# Patient Record
Sex: Male | Born: 1935
Health system: Southern US, Community
[De-identification: ages and names within clinical notes are randomized; demographics above are authoritative.]

## PROBLEM LIST (undated history)

## (undated) DIAGNOSIS — R569 Unspecified convulsions: Secondary | ICD-10-CM

## (undated) DIAGNOSIS — E079 Disorder of thyroid, unspecified: Secondary | ICD-10-CM

---

## 2014-05-07 ENCOUNTER — Other Ambulatory Visit: Payer: Self-pay | Admitting: Internal Medicine

## 2014-05-07 DIAGNOSIS — R569 Unspecified convulsions: Secondary | ICD-10-CM

## 2014-05-15 ENCOUNTER — Other Ambulatory Visit: Payer: Self-pay

## 2014-05-20 ENCOUNTER — Ambulatory Visit (INDEPENDENT_AMBULATORY_CARE_PROVIDER_SITE_OTHER): Payer: Medicare Other | Admitting: Radiology

## 2014-05-20 ENCOUNTER — Encounter (INDEPENDENT_AMBULATORY_CARE_PROVIDER_SITE_OTHER): Payer: Self-pay

## 2014-05-20 DIAGNOSIS — R404 Transient alteration of awareness: Secondary | ICD-10-CM

## 2014-05-20 NOTE — Procedures (Signed)
      Benjamin Burton is a 80 year old gentleman with a recent episode of loss of consciousness associated with generalized tonic-clonic movements, with confusion lasting about 30 minutes following this event. The patient has had 2 other episodes of syncope over the past several months. The patient is being evaluated for the seizures. He does have a history of Alzheimer's disease.  This is a routine EEG. No skull defects are noted. The patient currently is on Keppra.  EEG classification: Dysrhythmia grade 1 generalized  Description of the recording: The background rhythms of this recording consists of a moderately well modulated medium amplitude rhythm of 7-8 Hz. As the record progresses, the patient appears to remain in the waking state throughout the recording. The mild background slowing seen throughout the recording and is symmetric from one side to the next. Photic stimulation is performed, and this results in a bilateral photic driving response. Hyperventilation was not performed. At no time during the recording does there appear to be evidence of spike or spike wave discharges or evidence of focal slowing. EKG monitor shows no evidence of cardiac rhythm abnormalities with a heart rate of 60.  Impression: This is a minimally abnormal EEG recording secondary to mild background slowing in the theta frequency range. This is a nonspecific recording, and can be seen in any process that results in a mild toxic or metabolic encephalopathy or any dementing illness. No epileptiform discharges are seen during the study.

## 2014-05-21 ENCOUNTER — Other Ambulatory Visit: Payer: Self-pay | Admitting: Internal Medicine

## 2014-05-21 ENCOUNTER — Ambulatory Visit
Admission: RE | Admit: 2014-05-21 | Discharge: 2014-05-21 | Disposition: A | Discharge status: Home / Self Care | Payer: Medicare Other | Source: Ambulatory Visit | Attending: Internal Medicine | Admitting: Internal Medicine

## 2014-05-21 DIAGNOSIS — R569 Unspecified convulsions: Secondary | ICD-10-CM

## 2015-01-16 ENCOUNTER — Emergency Department (HOSPITAL_COMMUNITY): Payer: Medicare Other

## 2015-01-16 ENCOUNTER — Encounter (HOSPITAL_COMMUNITY): Payer: Self-pay | Admitting: *Deleted

## 2015-01-16 ENCOUNTER — Emergency Department (HOSPITAL_COMMUNITY)
Admission: EM | Admit: 2015-01-16 | Discharge: 2015-01-16 | Disposition: A | Discharge status: Home / Self Care | Payer: Medicare Other | Attending: Emergency Medicine | Admitting: Emergency Medicine

## 2015-01-16 DIAGNOSIS — Z79899 Other long term (current) drug therapy: Secondary | ICD-10-CM | POA: Diagnosis not present

## 2015-01-16 DIAGNOSIS — Y998 Other external cause status: Secondary | ICD-10-CM | POA: Insufficient documentation

## 2015-01-16 DIAGNOSIS — E079 Disorder of thyroid, unspecified: Secondary | ICD-10-CM | POA: Diagnosis not present

## 2015-01-16 DIAGNOSIS — T1490XA Injury, unspecified, initial encounter: Secondary | ICD-10-CM

## 2015-01-16 DIAGNOSIS — Y92128 Other place in nursing home as the place of occurrence of the external cause: Secondary | ICD-10-CM | POA: Insufficient documentation

## 2015-01-16 DIAGNOSIS — W19XXXA Unspecified fall, initial encounter: Secondary | ICD-10-CM

## 2015-01-16 DIAGNOSIS — G40909 Epilepsy, unspecified, not intractable, without status epilepticus: Secondary | ICD-10-CM | POA: Insufficient documentation

## 2015-01-16 DIAGNOSIS — T148 Other injury of unspecified body region: Secondary | ICD-10-CM | POA: Diagnosis not present

## 2015-01-16 DIAGNOSIS — Y9301 Activity, walking, marching and hiking: Secondary | ICD-10-CM | POA: Insufficient documentation

## 2015-01-16 DIAGNOSIS — W1839XA Other fall on same level, initial encounter: Secondary | ICD-10-CM | POA: Diagnosis not present

## 2015-01-16 DIAGNOSIS — S0993XA Unspecified injury of face, initial encounter: Secondary | ICD-10-CM | POA: Diagnosis present

## 2015-01-16 HISTORY — DX: Unspecified convulsions: R56.9

## 2015-01-16 HISTORY — DX: Disorder of thyroid, unspecified: E07.9

## 2015-01-16 MED ORDER — ALPRAZOLAM 0.25 MG PO TABS
0.2500 mg | ORAL_TABLET | Freq: Once | ORAL | Status: AC
  Administered 2015-01-16: 0.25 mg via ORAL
  Filled 2015-01-16: qty 1

## 2015-01-16 NOTE — ED Notes (Signed)
Per EMS pt coming from [facility] at, memory care unit, with c/o fall, he was reaching for his watch, has abrasion on left cheek, was recently started on xanax. No blood thinners.

## 2015-01-16 NOTE — ED Provider Notes (Signed)
CSN: [id number]     Arrival date & time 01/16/15  0940 History   First MD Initiated Contact with Patient 01/16/15 0957     Chief Complaint  Patient presents with  . Fall     (Consider location/radiation/quality/duration/timing/severity/associated sxs/prior Treatment) HPI Comments: Patient here after with witnessed fall at the nursing home while the patient was walking. Patient bent over to get his watch and fell onto his face. No reported loss of consciousness. Patient recently started on benzodiazepine due to increased agitation. He is on his baseline according to the staff. No reported recent illnesses  Patient is a 81 y.o. male presenting with fall. The history is provided by medical records. The history is limited by the condition of the patient.  Fall    Past Medical History  Diagnosis Date  . Thyroid disease   . Seizures    History reviewed. No pertinent past surgical history. No family history on file. History  Substance Use Topics  . Smoking status: Unknown If Ever Smoked  . Smokeless tobacco: Not on file  . Alcohol Use: Not on file    Review of Systems  Unable to perform ROS     Allergies  Review of patient's allergies indicates no known allergies.  Home Medications   Prior to Admission medications   Medication Sig Start Date End Date Taking? Authorizing Provider  ALPRAZolam Prudy Feeler(XANAX) 1 MG tablet Take 1 mg by mouth 3 (three) times daily. 7 days starting 6.3.16   Yes Historical Provider, MD  levETIRAcetam (KEPPRA) 500 MG tablet Take 250 mg by mouth 2 (two) times daily.   Yes Historical Provider, MD  levothyroxine (SYNTHROID, LEVOTHROID) 125 MCG tablet Take 125 mcg by mouth daily before breakfast.   Yes Historical Provider, MD   BP 105/56 mmHg  Pulse 57  Temp(Src) 97.8 F (36.6 C) (Oral)  Resp 18  SpO2 97% Physical Exam  Constitutional: He appears well-developed and well-nourished.  Non-toxic appearance. No distress.  HENT:  Head: Normocephalic and  atraumatic.  Eyes: Conjunctivae, EOM and lids are normal. Pupils are equal, round, and reactive to light.  Neck: Normal range of motion. Neck supple. No tracheal deviation present. No thyroid mass present.  Cardiovascular: Normal rate, regular rhythm and normal heart sounds.  Exam reveals no gallop.   No murmur heard. Pulmonary/Chest: Effort normal and breath sounds normal. No stridor. No respiratory distress. He has no decreased breath sounds. He has no wheezes. He has no rhonchi. He has no rales.  Abdominal: Soft. Normal appearance and bowel sounds are normal. He exhibits no distension. There is no tenderness. There is no rebound and no CVA tenderness.  Musculoskeletal: Normal range of motion. He exhibits no edema or tenderness.  Neurological: He is alert. He has normal strength. No cranial nerve deficit or sensory deficit. GCS eye subscore is 4. GCS verbal subscore is 5. GCS motor subscore is 6.  Skin: Skin is warm and dry. No abrasion and no rash noted.  Psychiatric: His speech is normal. His affect is blunt. He is slowed.  Nursing note and vitals reviewed.   ED Course  Procedures (including critical care time) Labs Review Labs Reviewed - No data to display  Imaging Review No results found.   EKG Interpretation None      MDM   Final diagnoses:  Trauma  Trauma    Head and neck CT without acute findings. Stable for discharge    Lorre NickAnthony Jhene Westmoreland, MD 01/16/15 1450

## 2015-01-16 NOTE — Discharge Instructions (Signed)
Follow-up with with your doctor as needed

## 2015-01-16 NOTE — ED Notes (Signed)
Bed: WA10 Expected date:  Expected time:  Means of arrival:  Comments: fall 

## 2015-01-16 NOTE — ED Notes (Signed)
Called CT to inquire about delay, per radiology tech they are having  staffing issues and are working on resolving those as soon as they can. Family and patient updated.

## 2015-01-16 NOTE — ED Notes (Signed)
Patient transported to CT 

## 2015-02-02 ENCOUNTER — Ambulatory Visit
Admission: RE | Admit: 2015-02-02 | Discharge: 2015-02-02 | Disposition: A | Discharge status: Home / Self Care | Payer: Medicare Other | Source: Ambulatory Visit | Attending: Internal Medicine | Admitting: Internal Medicine

## 2015-02-02 ENCOUNTER — Other Ambulatory Visit: Payer: Self-pay | Admitting: Internal Medicine

## 2015-02-02 DIAGNOSIS — R600 Localized edema: Secondary | ICD-10-CM

## 2015-02-02 DIAGNOSIS — R0989 Other specified symptoms and signs involving the circulatory and respiratory systems: Secondary | ICD-10-CM

## 2016-09-14 DEATH — deceased

## 2016-12-05 IMAGING — CT CT HEAD W/O CM
2 of 6 series · 13 of 47 positions shown, 16 images · non-contrast
Comparison: MRI brain 05/21/2014

CLINICAL DATA: Fall today.  History of dementia and seizures.

EXAM:
CT HEAD WITHOUT CONTRAST
CT CERVICAL SPINE WITHOUT CONTRAST
TECHNIQUE: Multidetector CT imaging of the head and cervical spine was
performed following the standard protocol without intravenous
contrast. Multiplanar CT image reconstructions of the cervical spine
were also generated.

[Series 8: axial recon · axial · 0.23mm/px · z∈[-357,-195]mm · 10 of 114 slices shown, 13 images]
[im 11/114  brain]
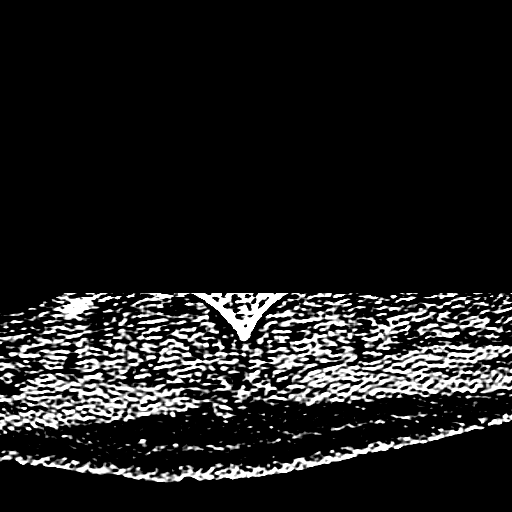
[im 11/114  bone]
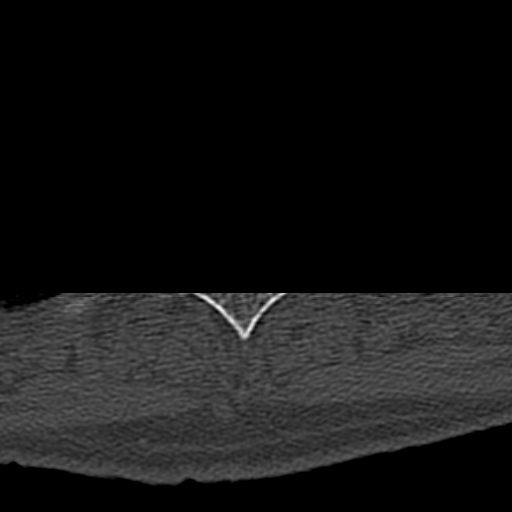
[im 21/114  brain]
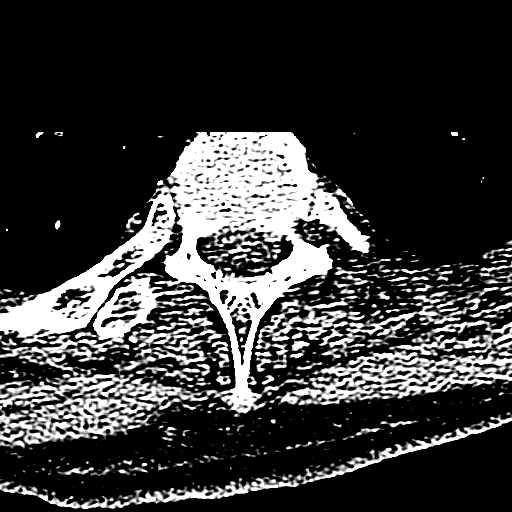
[im 31/114  brain]
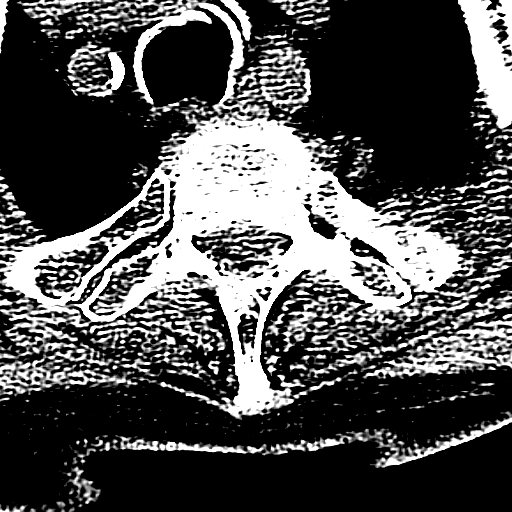
[im 42/114  brain]
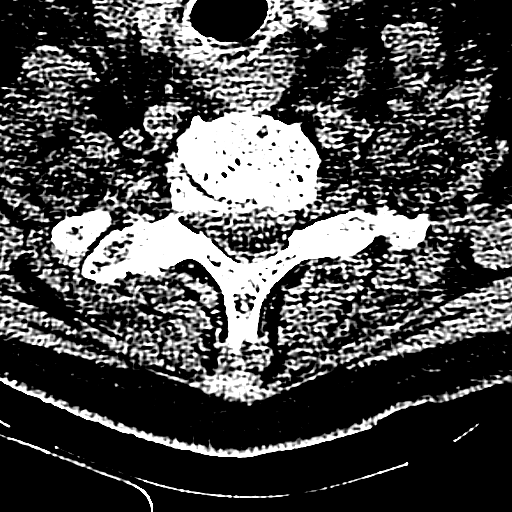
[im 52/114  brain]
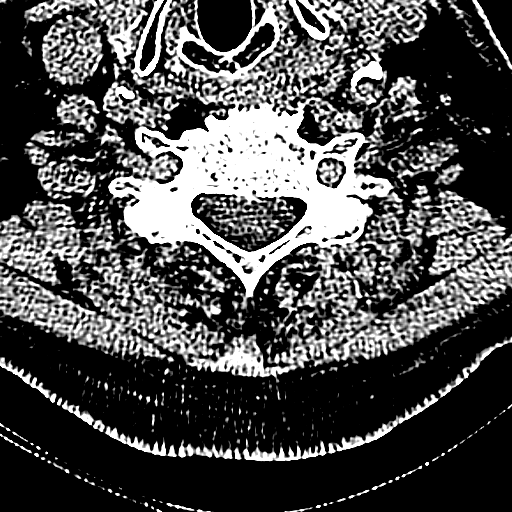
[im 52/114  bone]
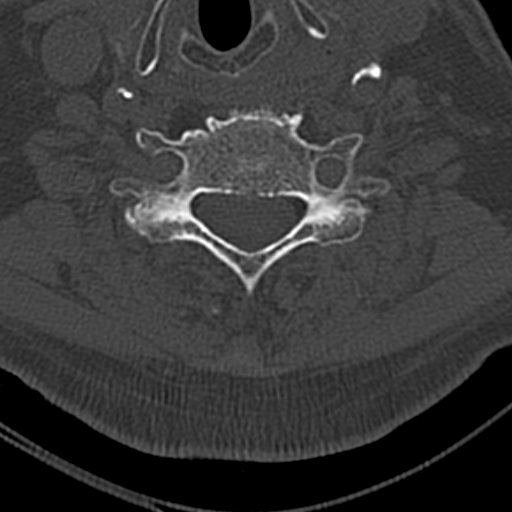
[im 62/114  brain]
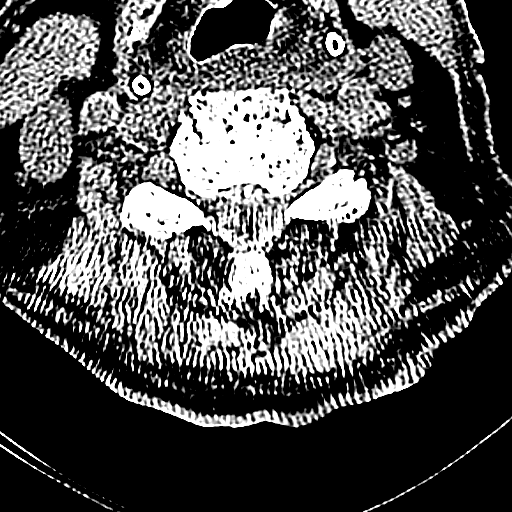
[im 72/114  brain]
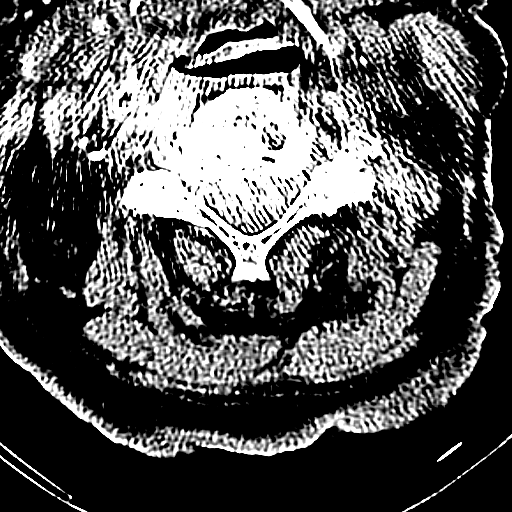
[im 83/114  brain]
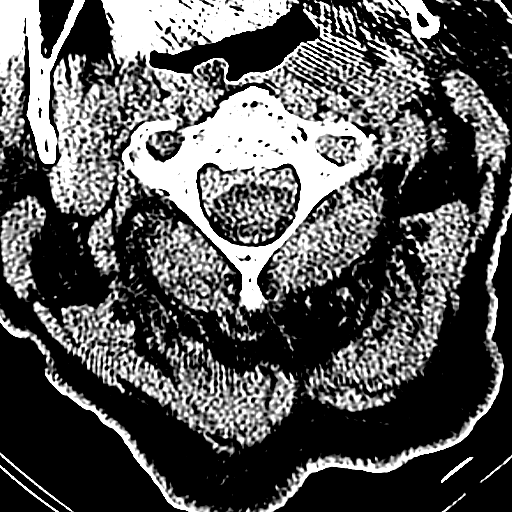
[im 93/114  brain]
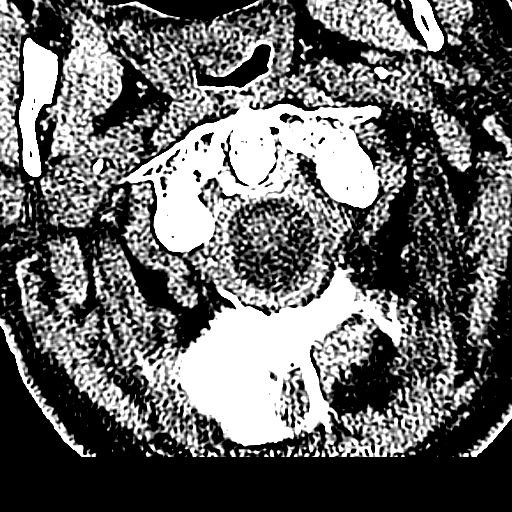
[im 93/114  bone]
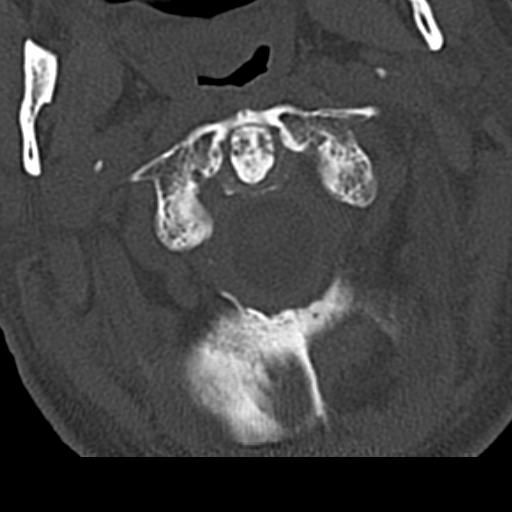
[im 103/114  brain]
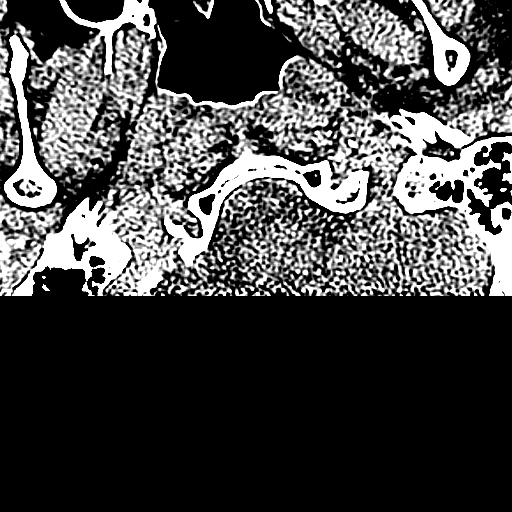

[Series 9: coronal · coronal · 0.23mm/px · 3 of 67 slices shown]
[im 23/67  brain]
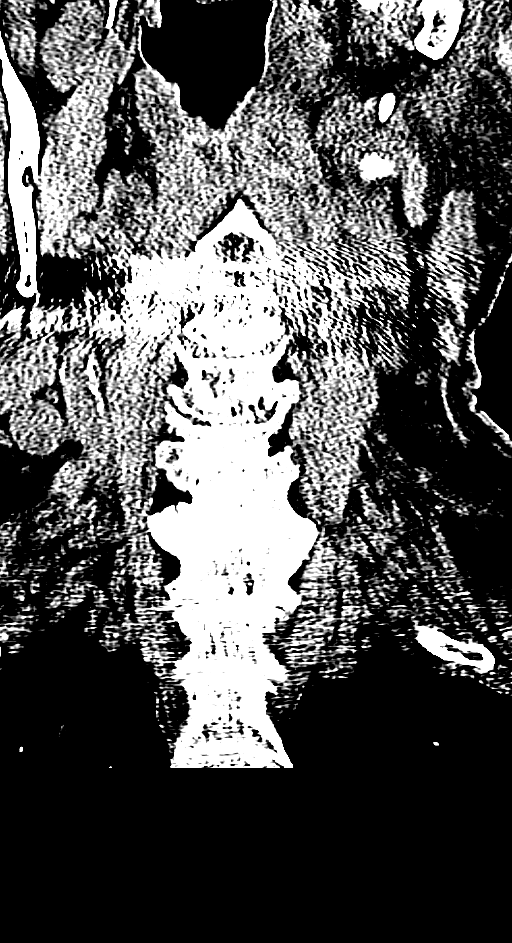
[im 30/67  brain]
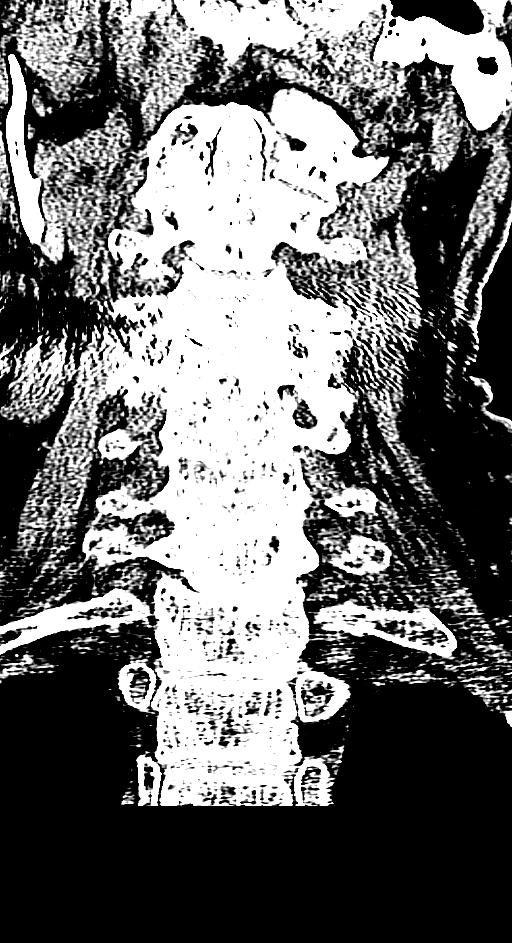
[im 37/67  brain]
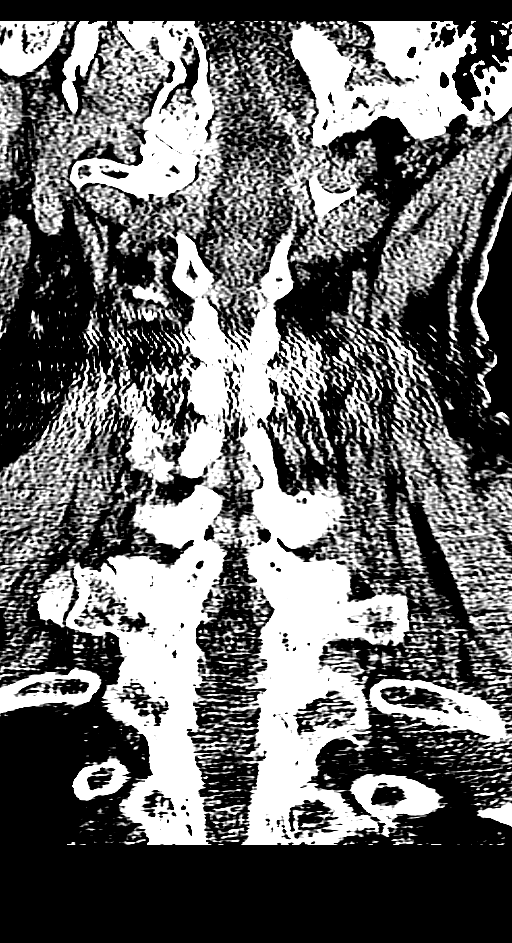

[13 of 47 positions shown; findings below may reference images not displayed]

FINDINGS: CT HEAD FINDINGS

Advanced generalized atrophy is present. Periventricular white
matter hypoattenuation is stable. The ventricles are proportionate
to the degree of atrophy. The basal ganglia are intact. No
significant extraaxial fluid collection is present. No acute
infarct, hemorrhage, or mass lesion is present.

The paranasal sinuses and mastoid air cells are clear. The calvarium
is intact. No significant extracranial soft tissue injury is
present. The globes and orbits are intact.

CT CERVICAL SPINE FINDINGS

Cervical spine is imaged from the skullbase through T2-3. There is
fusion across the disc space at C4-5, C5-6, and C6-7. Marked loss of
height is noted at C3-4 with uncovertebral spurring. Osseous
foraminal narrowing is most pronounced at C3-4 and C4-5.

No acute fracture or traumatic subluxation is present. Facet
degenerative changes are present bilaterally.

The soft tissues the neck demonstrate vascular calcifications at the
carotid bifurcations bilaterally, more prominent on the left. The
lung apices are clear.
IMPRESSION: 1. Advanced atrophy and white matter disease is stable compared to
the prior exam.
2. No acute intracranial abnormality.
3. No evidence for acute trauma to the head.
4. Acquired fusion of the cervical spine at C4-5, C5-6, and C6-7.
5. Marked loss of disc height with uncovertebral spurring at C3-4.
Osseous foraminal narrowing is greatest C3-4 and C4-5.
6. No acute fracture or traumatic subluxation in the cervical spine.

## 2016-12-22 IMAGING — CR DG CHEST 2V
2 series · 2 of 2 positions shown · non-contrast
Comparison: None.

CLINICAL DATA: Abnormal chest sounds on physical exam yesterday,
cough and dyspnea for 3 months

EXAM:
CHEST - 2 VIEW

[view not recorded (1 of 2)]
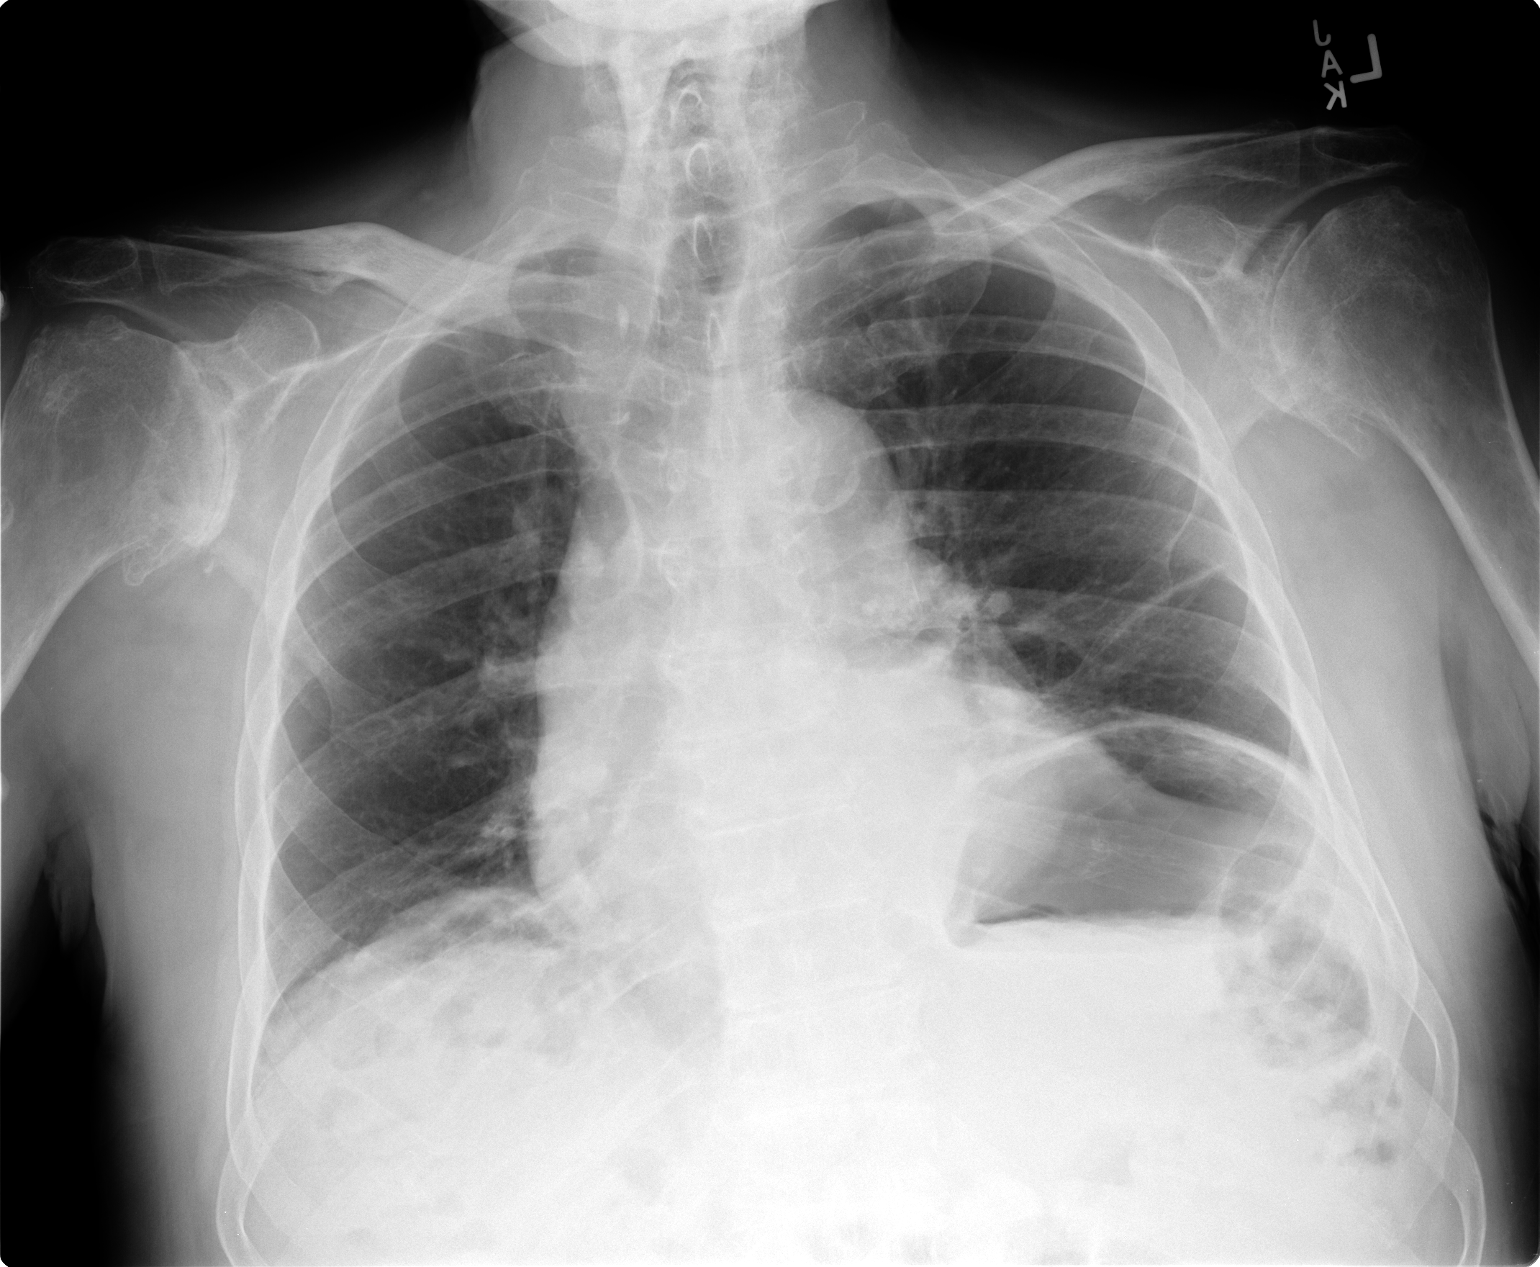

[view not recorded (2 of 2)]
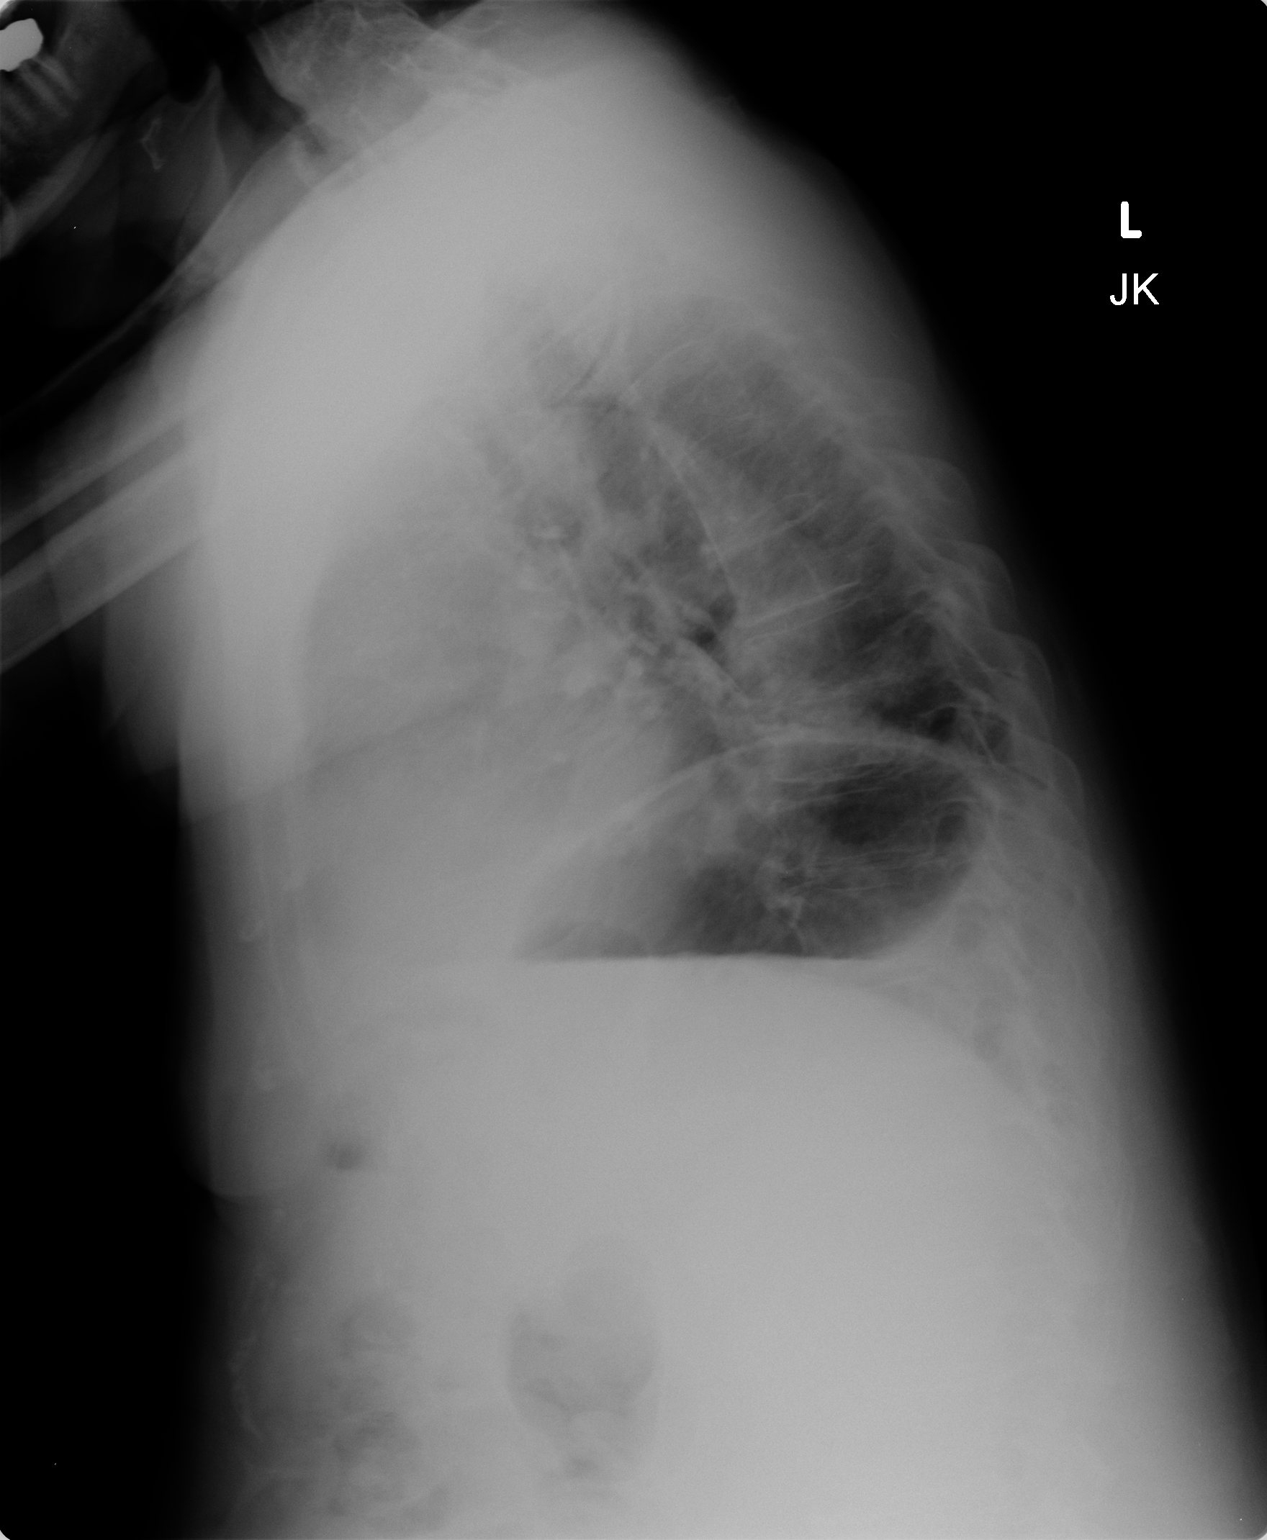

[2 of 2 positions shown; findings below may reference images not displayed]

FINDINGS: Cardiac shadow is within normal limits. The left hemidiaphragm is
elevated. Minimal left basilar atelectasis is noted. No focal
confluent infiltrate or sizable effusion is seen. Degenerative
changes of the thoracic spine and shoulder joints are noted.
IMPRESSION: Mild left basilar atelectasis.
